# Patient Record
Sex: Male | Born: 1950 | Race: White | Hispanic: No | Marital: Married | Smoking: Never smoker
Health system: Southern US, Community
[De-identification: ages and names within clinical notes are randomized; demographics above are authoritative.]

## PROBLEM LIST (undated history)

## (undated) ENCOUNTER — Emergency Department (HOSPITAL_COMMUNITY): Payer: Self-pay

## (undated) DIAGNOSIS — T7840XA Allergy, unspecified, initial encounter: Secondary | ICD-10-CM

## (undated) DIAGNOSIS — E785 Hyperlipidemia, unspecified: Secondary | ICD-10-CM

## (undated) DIAGNOSIS — K219 Gastro-esophageal reflux disease without esophagitis: Secondary | ICD-10-CM

## (undated) HISTORY — DX: Allergy, unspecified, initial encounter: T78.40XA

## (undated) HISTORY — DX: Gastro-esophageal reflux disease without esophagitis: K21.9

## (undated) HISTORY — DX: Hyperlipidemia, unspecified: E78.5

---

## 2007-02-15 ENCOUNTER — Ambulatory Visit: Payer: Self-pay | Admitting: Gastroenterology

## 2007-03-01 ENCOUNTER — Ambulatory Visit: Payer: Self-pay | Admitting: Gastroenterology

## 2014-08-03 HISTORY — PX: UPPER GASTROINTESTINAL ENDOSCOPY: SHX188

## 2014-11-09 ENCOUNTER — Other Ambulatory Visit: Payer: Self-pay | Admitting: Internal Medicine

## 2014-11-09 DIAGNOSIS — R1310 Dysphagia, unspecified: Secondary | ICD-10-CM

## 2014-11-12 ENCOUNTER — Ambulatory Visit
Admission: RE | Admit: 2014-11-12 | Discharge: 2014-11-12 | Disposition: A | Discharge status: Home / Self Care | Payer: BLUE CROSS/BLUE SHIELD | Source: Ambulatory Visit | Attending: Internal Medicine | Admitting: Internal Medicine

## 2014-11-12 DIAGNOSIS — R1310 Dysphagia, unspecified: Secondary | ICD-10-CM

## 2014-11-29 ENCOUNTER — Other Ambulatory Visit: Payer: Self-pay | Admitting: Gastroenterology

## 2014-11-29 DIAGNOSIS — R1310 Dysphagia, unspecified: Secondary | ICD-10-CM

## 2016-01-23 DIAGNOSIS — J019 Acute sinusitis, unspecified: Secondary | ICD-10-CM | POA: Diagnosis not present

## 2016-02-03 DIAGNOSIS — E785 Hyperlipidemia, unspecified: Secondary | ICD-10-CM | POA: Diagnosis not present

## 2016-02-03 DIAGNOSIS — Z0001 Encounter for general adult medical examination with abnormal findings: Secondary | ICD-10-CM | POA: Diagnosis not present

## 2016-02-03 DIAGNOSIS — Z125 Encounter for screening for malignant neoplasm of prostate: Secondary | ICD-10-CM | POA: Diagnosis not present

## 2016-02-03 DIAGNOSIS — E559 Vitamin D deficiency, unspecified: Secondary | ICD-10-CM | POA: Diagnosis not present

## 2016-02-03 DIAGNOSIS — E039 Hypothyroidism, unspecified: Secondary | ICD-10-CM | POA: Diagnosis not present

## 2016-02-08 IMAGING — RF DG ESOPHAGUS
16 of 19 series · 19 of 24 positions shown · non-contrast
Comparison: None.

CLINICAL DATA: Dysphagia.

EXAM:
ESOPHOGRAM/BARIUM SWALLOW
TECHNIQUE: Combined double contrast and single contrast examination performed
using effervescent crystals, thick barium liquid, and thin barium
liquid.
FLUOROSCOPY TIME:  Radiation Exposure Index (as provided by the
fluoroscopic device): 44 dGy cm2
If the device does not provide the exposure index:
Fluoroscopy Time:  2 minutes and 24 seconds
Number of Acquired Images:

[Series 1: run · 2 of 7 slices shown (1 of 16)]
[im 1/7]
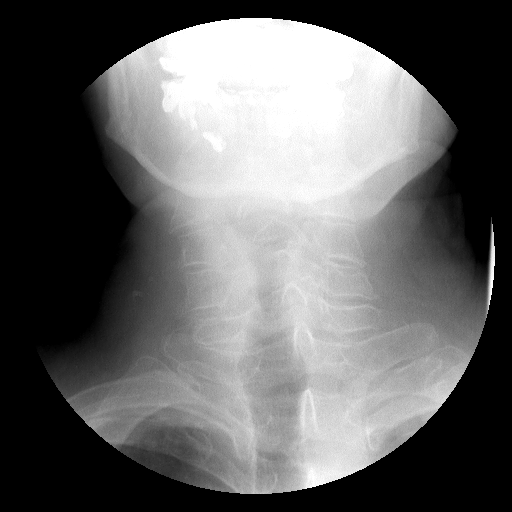
[im 7/7]
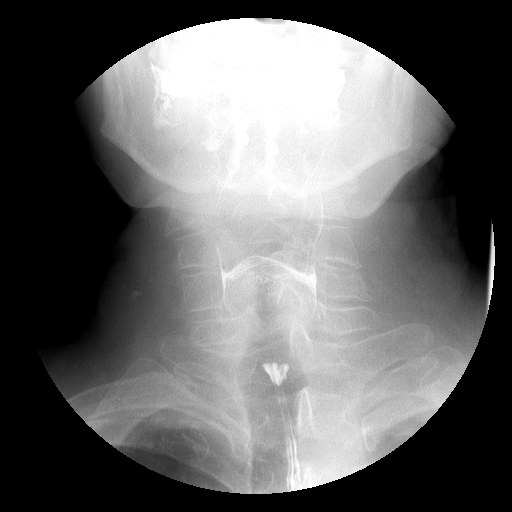

[Series 2: run · 2 of 8 slices shown (2 of 16)]
[im 4/8]
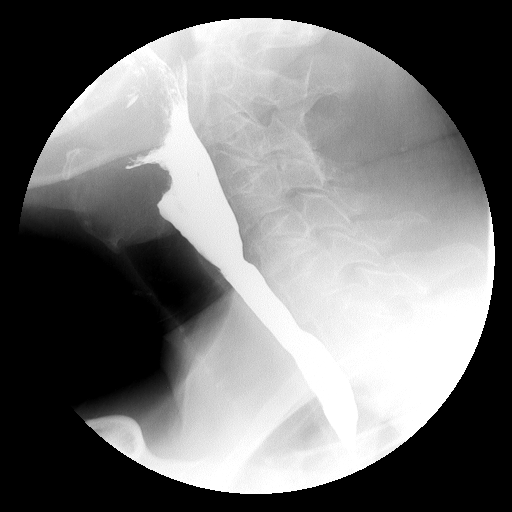
[im 8/8]
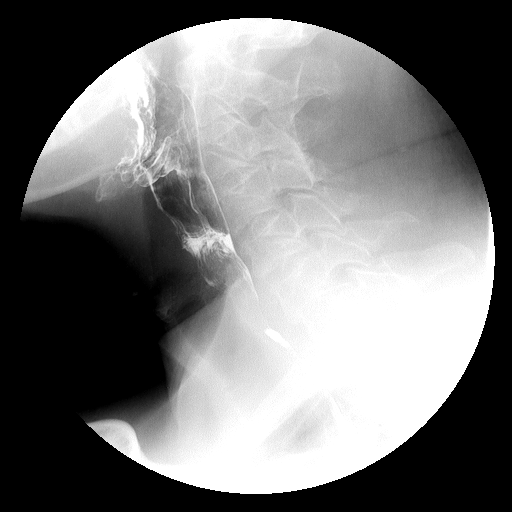

[Series 3: run · 1 of 3 slices shown (3 of 16)]
[im 1/3]
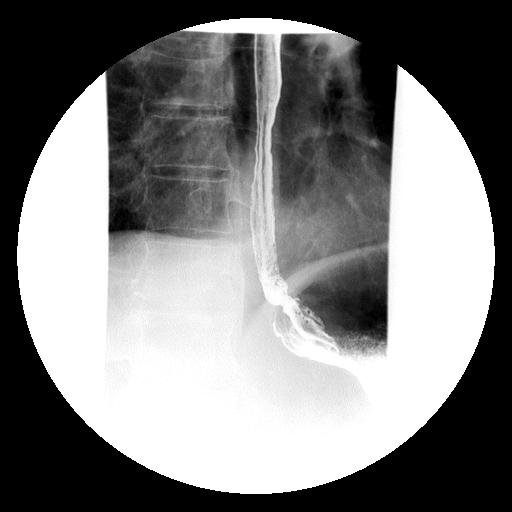

[Series 4: run · 2 of 7 slices shown (4 of 16)]
[im 1/7]
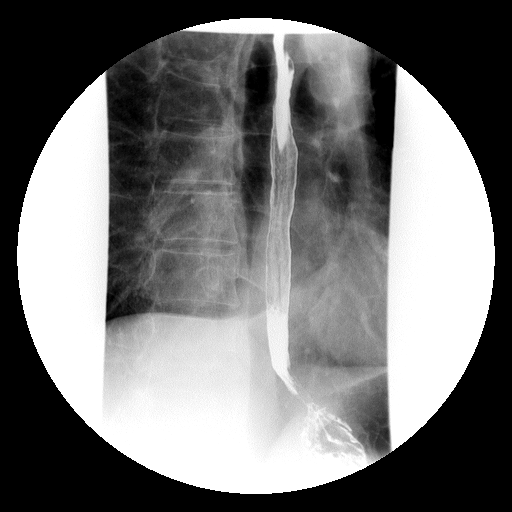
[im 7/7]
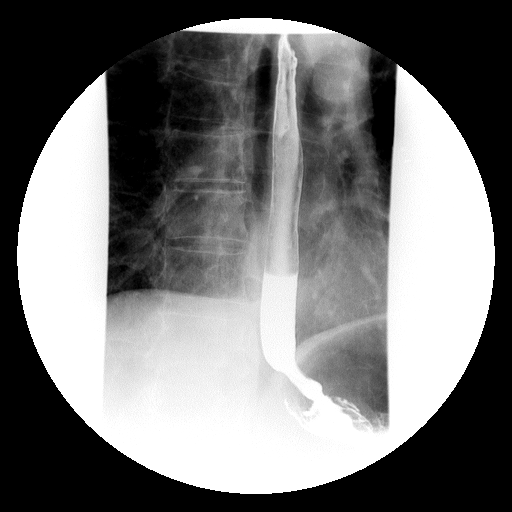

[Series 5: run · 1 of 3 slices shown (5 of 16)]
[im 1/3]
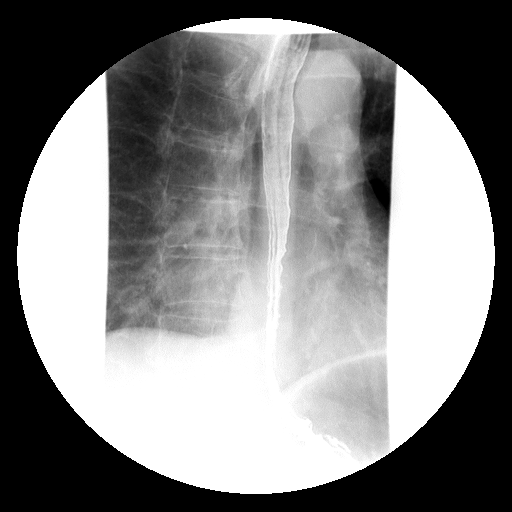

[Series 6: run · 1 of 1 slices shown (6 of 16)]
[im 1/1]
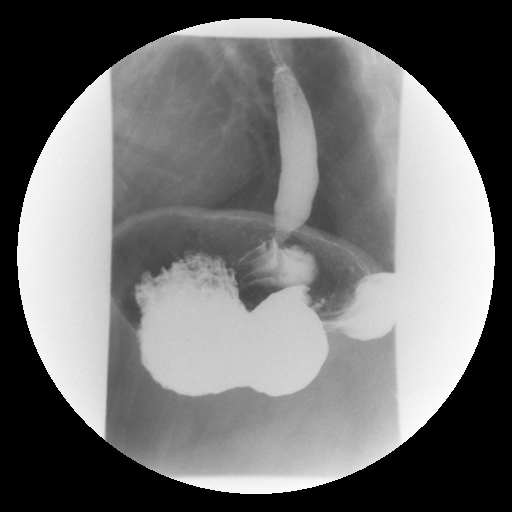

[Series 8: run · 1 of 1 slices shown (7 of 16)]
[im 1/1]
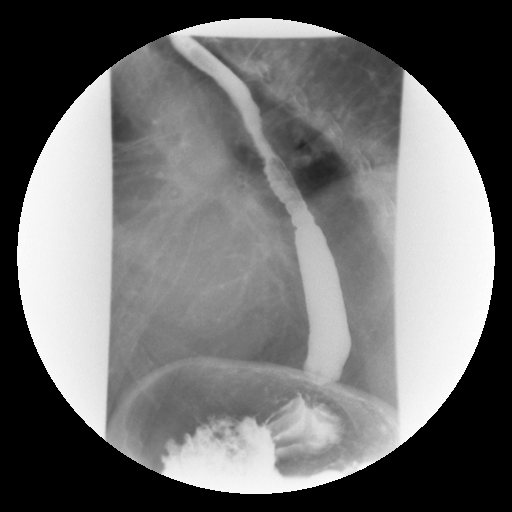

[Series 9: run · 1 of 1 slices shown (8 of 16)]
[im 1/1]
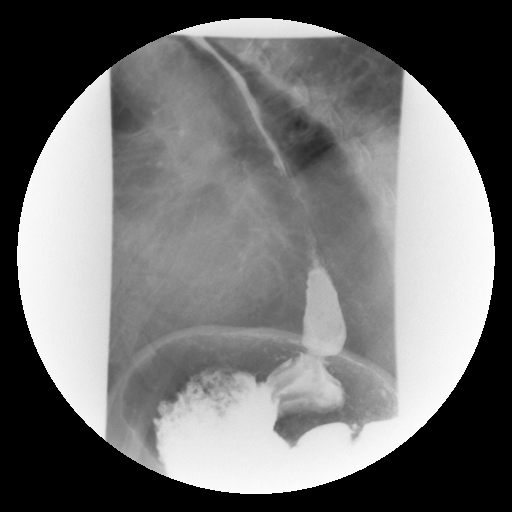

[Series 10: run · 1 of 1 slices shown (9 of 16)]
[im 1/1]
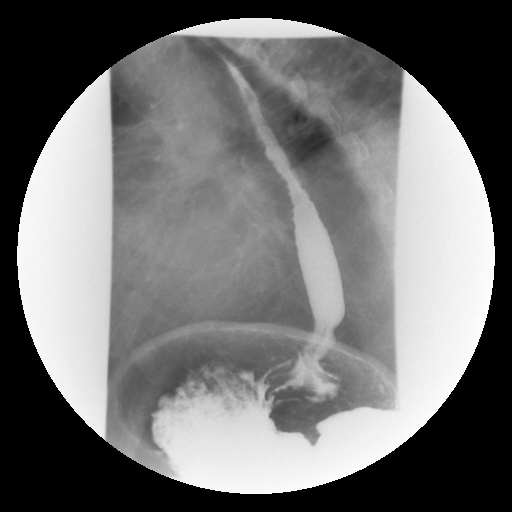

[Series 11: run · 1 of 1 slices shown (10 of 16)]
[im 1/1]
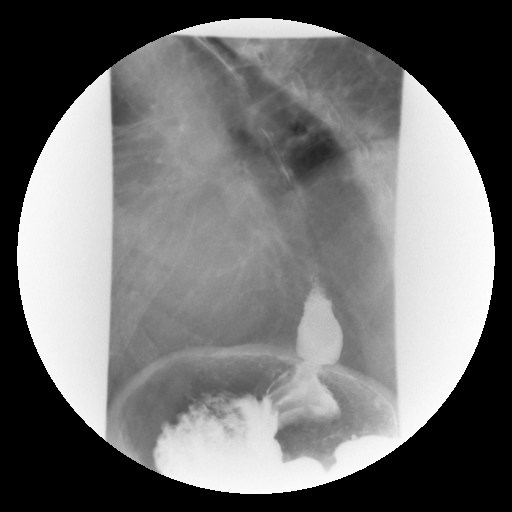

[Series 13: run · 1 of 1 slices shown (11 of 16)]
[im 1/1]
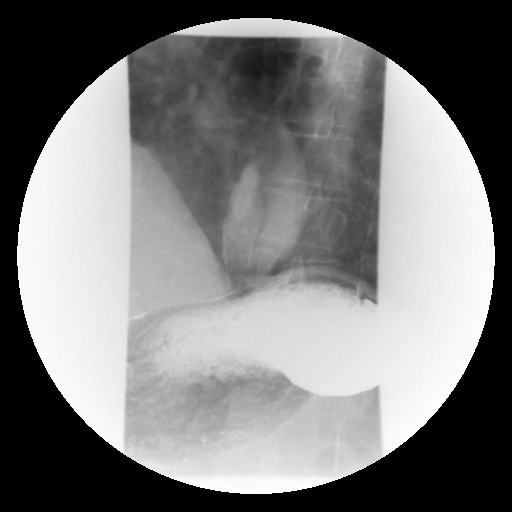

[Series 14: run · 1 of 1 slices shown (12 of 16)]
[im 1/1]
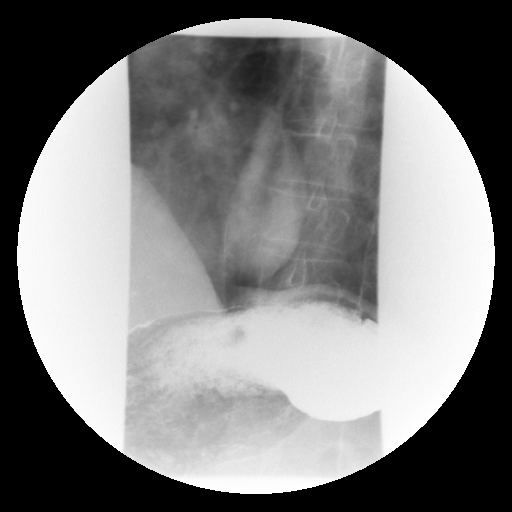

[Series 15: run · 1 of 1 slices shown (13 of 16)]
[im 1/1]
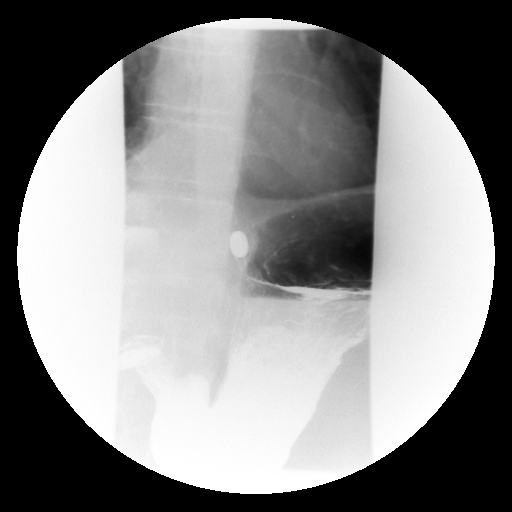

[Series 16: run · 1 of 1 slices shown (14 of 16)]
[im 1/1]
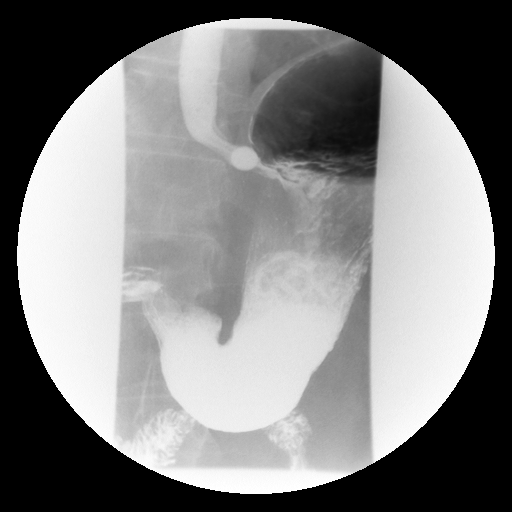

[Series 18: run · 1 of 1 slices shown (15 of 16)]
[im 1/1]
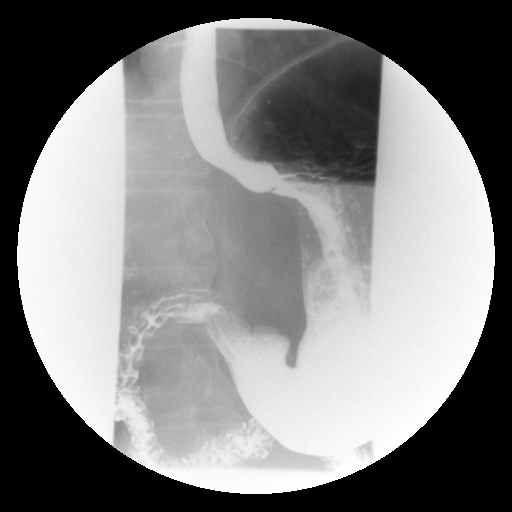

[Series 19: run · 1 of 1 slices shown (16 of 16)]
[im 1/1]
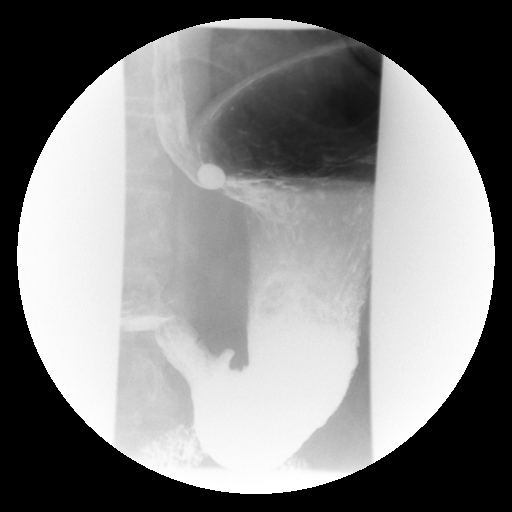

[19 of 24 positions shown; findings below may reference images not displayed]

FINDINGS: Initial barium swallows demonstrate normal pharyngeal motion with
swallowing. No laryngeal penetration or aspiration. No upper
esophageal webs, strictures or diverticuli. Normal esophageal
motility. No intrinsic or extrinsic lesions are identified. No
hiatal hernia. Minimal inducible GE reflux was noted with water
swallowing. The 13 mm barium pill became lodged in the distal
esophagus at the GE junction and would not pass through. Findings
probably represent a reflux stricture or GE junction ring.
IMPRESSION: Smooth narrowing at the GE junction likely a focal reflux stricture
or GE junction ring. The 13 mm barium pill would not pass through
this area.

No hiatal hernia. Minimal inducible GE reflux with water swallowing.

## 2016-02-10 DIAGNOSIS — K219 Gastro-esophageal reflux disease without esophagitis: Secondary | ICD-10-CM | POA: Diagnosis not present

## 2016-02-10 DIAGNOSIS — E785 Hyperlipidemia, unspecified: Secondary | ICD-10-CM | POA: Diagnosis not present

## 2016-02-10 DIAGNOSIS — E038 Other specified hypothyroidism: Secondary | ICD-10-CM | POA: Diagnosis not present

## 2016-02-10 DIAGNOSIS — J309 Allergic rhinitis, unspecified: Secondary | ICD-10-CM | POA: Diagnosis not present

## 2016-02-10 DIAGNOSIS — E559 Vitamin D deficiency, unspecified: Secondary | ICD-10-CM | POA: Diagnosis not present

## 2016-02-10 DIAGNOSIS — Z23 Encounter for immunization: Secondary | ICD-10-CM | POA: Diagnosis not present

## 2016-05-26 DIAGNOSIS — Z23 Encounter for immunization: Secondary | ICD-10-CM | POA: Diagnosis not present

## 2016-08-25 DIAGNOSIS — E038 Other specified hypothyroidism: Secondary | ICD-10-CM | POA: Diagnosis not present

## 2016-08-25 DIAGNOSIS — K219 Gastro-esophageal reflux disease without esophagitis: Secondary | ICD-10-CM | POA: Diagnosis not present

## 2016-08-25 DIAGNOSIS — E559 Vitamin D deficiency, unspecified: Secondary | ICD-10-CM | POA: Diagnosis not present

## 2016-08-25 DIAGNOSIS — E785 Hyperlipidemia, unspecified: Secondary | ICD-10-CM | POA: Diagnosis not present

## 2016-09-01 DIAGNOSIS — Z23 Encounter for immunization: Secondary | ICD-10-CM | POA: Diagnosis not present

## 2016-09-01 DIAGNOSIS — K219 Gastro-esophageal reflux disease without esophagitis: Secondary | ICD-10-CM | POA: Diagnosis not present

## 2016-09-01 DIAGNOSIS — E038 Other specified hypothyroidism: Secondary | ICD-10-CM | POA: Diagnosis not present

## 2016-09-01 DIAGNOSIS — E559 Vitamin D deficiency, unspecified: Secondary | ICD-10-CM | POA: Diagnosis not present

## 2016-09-01 DIAGNOSIS — E785 Hyperlipidemia, unspecified: Secondary | ICD-10-CM | POA: Diagnosis not present

## 2016-11-27 DIAGNOSIS — H52223 Regular astigmatism, bilateral: Secondary | ICD-10-CM | POA: Diagnosis not present

## 2016-11-27 DIAGNOSIS — H2513 Age-related nuclear cataract, bilateral: Secondary | ICD-10-CM | POA: Diagnosis not present

## 2016-11-27 DIAGNOSIS — H5203 Hypermetropia, bilateral: Secondary | ICD-10-CM | POA: Diagnosis not present

## 2016-11-27 DIAGNOSIS — H524 Presbyopia: Secondary | ICD-10-CM | POA: Diagnosis not present

## 2016-12-25 ENCOUNTER — Encounter: Payer: Self-pay | Admitting: Gastroenterology

## 2017-01-12 ENCOUNTER — Encounter: Payer: Self-pay | Admitting: Gastroenterology

## 2017-03-03 ENCOUNTER — Telehealth: Payer: Self-pay | Admitting: *Deleted

## 2017-03-03 ENCOUNTER — Ambulatory Visit (AMBULATORY_SURGERY_CENTER): Payer: Self-pay | Admitting: *Deleted

## 2017-03-03 VITALS — Ht 71.0 in | Wt 178.4 lb

## 2017-03-03 DIAGNOSIS — Z1211 Encounter for screening for malignant neoplasm of colon: Secondary | ICD-10-CM

## 2017-03-03 MED ORDER — NA SULFATE-K SULFATE-MG SULF 17.5-3.13-1.6 GM/177ML PO SOLN: ORAL | 0 refills | Status: DC

## 2017-03-03 MED ORDER — ONDANSETRON HCL 4 MG PO TABS: 4.0000 mg | ORAL_TABLET | ORAL | 0 refills | Status: DC

## 2017-03-03 NOTE — Progress Notes (Signed)
No allergies to eggs or soy. No problems with anesthesia.  Pt given Emmi instructions for colonoscopy  No oxygen use  No diet drug use  

## 2017-03-03 NOTE — Telephone Encounter (Signed)
Ok,  zofran 4mg  pills, take one pill 1 hour prior to starting each stage of the prep.  Disp 2 pills, no refills.  Thanks

## 2017-03-03 NOTE — Telephone Encounter (Signed)
Pt notified that Rx for Zofran has been sent to his pharmacy

## 2017-03-03 NOTE — Telephone Encounter (Signed)
Dr Ardis Hughs: pt is scheduled for recall colon 8/15.  Last colonoscopy 2008.  Pt had nausea and vomiting with last prep and is requesting nausea me to take before prep.  Thanks, Juliann Pulse in [facility]

## 2017-03-12 DIAGNOSIS — E784 Other hyperlipidemia: Secondary | ICD-10-CM | POA: Diagnosis not present

## 2017-03-12 DIAGNOSIS — J3089 Other allergic rhinitis: Secondary | ICD-10-CM | POA: Diagnosis not present

## 2017-03-12 DIAGNOSIS — K219 Gastro-esophageal reflux disease without esophagitis: Secondary | ICD-10-CM | POA: Diagnosis not present

## 2017-03-12 DIAGNOSIS — Z1389 Encounter for screening for other disorder: Secondary | ICD-10-CM | POA: Diagnosis not present

## 2017-03-12 DIAGNOSIS — Z0000 Encounter for general adult medical examination without abnormal findings: Secondary | ICD-10-CM | POA: Diagnosis not present

## 2017-03-12 DIAGNOSIS — Z125 Encounter for screening for malignant neoplasm of prostate: Secondary | ICD-10-CM | POA: Diagnosis not present

## 2017-03-12 DIAGNOSIS — R946 Abnormal results of thyroid function studies: Secondary | ICD-10-CM | POA: Diagnosis not present

## 2017-03-12 DIAGNOSIS — Z6824 Body mass index (BMI) 24.0-24.9, adult: Secondary | ICD-10-CM | POA: Diagnosis not present

## 2017-03-17 ENCOUNTER — Ambulatory Visit (AMBULATORY_SURGERY_CENTER): Payer: Medicare Other | Admitting: Gastroenterology

## 2017-03-17 ENCOUNTER — Encounter: Payer: Self-pay | Admitting: Gastroenterology

## 2017-03-17 VITALS — BP 105/66 | HR 78 | Temp 97.8°F | Resp 15 | Ht 71.0 in | Wt 178.0 lb

## 2017-03-17 DIAGNOSIS — D120 Benign neoplasm of cecum: Secondary | ICD-10-CM | POA: Diagnosis not present

## 2017-03-17 DIAGNOSIS — K5730 Diverticulosis of large intestine without perforation or abscess without bleeding: Secondary | ICD-10-CM | POA: Diagnosis not present

## 2017-03-17 DIAGNOSIS — Z1211 Encounter for screening for malignant neoplasm of colon: Secondary | ICD-10-CM | POA: Diagnosis not present

## 2017-03-17 DIAGNOSIS — Z1212 Encounter for screening for malignant neoplasm of rectum: Secondary | ICD-10-CM | POA: Diagnosis not present

## 2017-03-17 DIAGNOSIS — K635 Polyp of colon: Secondary | ICD-10-CM

## 2017-03-17 MED ORDER — SODIUM CHLORIDE 0.9 % IV SOLN: 500.0000 mL | INTRAVENOUS | Status: AC

## 2017-03-17 NOTE — Op Note (Signed)
[facility] Patient Name: John Bruce Procedure Date: 03/17/2017 8:17 AM MRN: [id number] Endoscopist: Milus Banister , MD Age: 66 Referring MD:  Date of Birth: 07-Nov-1950 Gender: Male Account #: [id number] Procedure:                Colonoscopy Indications:              Screening for colorectal malignant neoplasm Medicines:                Monitored Anesthesia Care Procedure:                Pre-Anesthesia Assessment:                           - Prior to the procedure, a History and Physical                            was performed, and patient medications and                            allergies were reviewed. The patient's tolerance of                            previous anesthesia was also reviewed. The risks                            and benefits of the procedure and the sedation                            options and risks were discussed with the patient.                            All questions were answered, and informed consent                            was obtained. Prior Anticoagulants: The patient has                            taken no previous anticoagulant or antiplatelet                            agents. ASA Grade Assessment: III - A patient with                            severe systemic disease. After reviewing the risks                            and benefits, the patient was deemed in                            satisfactory condition to undergo the procedure.                           After obtaining informed consent, the colonoscope  was passed under direct vision. Throughout the                            procedure, the patient's blood pressure, pulse, and                            oxygen saturations were monitored continuously. The                            Colonoscope was introduced through the anus and                            advanced to the the cecum, identified by                            appendiceal orifice and  ileocecal valve. The                            colonoscopy was performed without difficulty. The                            patient tolerated the procedure well. The quality                            of the bowel preparation was good. The ileocecal                            valve, appendiceal orifice, and rectum were                            photographed. Scope In: 8:33:29 AM Scope Out: 8:45:30 AM Scope Withdrawal Time: 0 hours 8 minutes 20 seconds  Total Procedure Duration: 0 hours 12 minutes 1 second  Findings:                 A 9 mm polyp was found in the cecum. The polyp was                            sessile. The polyp was removed with a hot snare.                            Resection and retrieval were complete.                           Scattered small-mouthed diverticula were found in                            the entire colon.                           The exam was otherwise without abnormality on                            direct and retroflexion views. Complications:  No immediate complications. Estimated blood loss:                            None. Estimated Blood Loss:     Estimated blood loss: none. Impression:               - One 9 mm polyp in the cecum, removed with a hot                            snare. Resected and retrieved.                           - Diverticulosis in the entire examined colon.                           - The examination was otherwise normal on direct                            and retroflexion views. Recommendation:           - Patient has a contact number available for                            emergencies. The signs and symptoms of potential                            delayed complications were discussed with the                            patient. Return to normal activities tomorrow.                            Written discharge instructions were provided to the                            patient.                           -  Resume previous diet.                           - Continue present medications.                           You will receive a letter within 2-3 weeks with the                            pathology results and my final recommendations.                           If the polyp(s) is proven to be 'pre-cancerous' on                            pathology, you will need repeat colonoscopy in 5  years. If the polyp(s) is NOT 'precancerous' on                            pathology then you should repeat colon cancer                            screening in 10 years with colonoscopy without need                            for colon cancer screening by any method prior to                            then (including stool testing). Milus Banister, MD 03/17/2017 8:53:35 AM This report has been signed electronically.

## 2017-03-17 NOTE — Progress Notes (Signed)
Called to room to assist during endoscopic procedure.  Patient ID and intended procedure confirmed with present staff. Received instructions for my participation in the procedure from the performing physician.  

## 2017-03-17 NOTE — Progress Notes (Signed)
To recovery, report to [place], RN, VSS 

## 2017-03-17 NOTE — Patient Instructions (Signed)
YOU HAD AN ENDOSCOPIC PROCEDURE TODAY AT THE [facility]:   Refer to the procedure report that was given to you for any specific questions about what was found during the examination.  If the procedure report does not answer your questions, please call your gastroenterologist to clarify.  If you requested that your care partner not be given the details of your procedure findings, then the procedure report has been included in a sealed envelope for you to review at your convenience later.  YOU SHOULD EXPECT: Some feelings of bloating in the abdomen. Passage of more gas than usual.  Walking can help get rid of the air that was put into your GI tract during the procedure and reduce the bloating. If you had a lower endoscopy (such as a colonoscopy or flexible sigmoidoscopy) you may notice spotting of blood in your stool or on the toilet paper. If you underwent a bowel prep for your procedure, you may not have a normal bowel movement for a few days.  Please Note:  You might notice some irritation and congestion in your nose or some drainage.  This is from the oxygen used during your procedure.  There is no need for concern and it should clear up in a day or so.  SYMPTOMS TO REPORT IMMEDIATELY:   Following lower endoscopy (colonoscopy or flexible sigmoidoscopy):  Excessive amounts of blood in the stool  Significant tenderness or worsening of abdominal pains  Swelling of the abdomen that is new, acute  Fever of 100F or higher    For urgent or emergent issues, a gastroenterologist can be reached at any hour by calling [phone].   DIET:  We do recommend a small meal at first, but then you may proceed to your regular diet.  Drink plenty of fluids but you should avoid alcoholic beverages for 24 hours.  ACTIVITY:  You should plan to take it easy for the rest of today and you should NOT DRIVE or use heavy machinery until tomorrow (because of the sedation medicines used during the test).     FOLLOW UP: Our staff will call the number listed on your records the next business day following your procedure to check on you and address any questions or concerns that you may have regarding the information given to you following your procedure. If we do not reach you, we will leave a message.  However, if you are feeling well and you are not experiencing any problems, there is no need to return our call.  We will assume that you have returned to your regular daily activities without incident.  If any biopsies were taken you will be contacted by phone or by letter within the next 1-3 weeks.  Please call us at [phone] if you have not heard about the biopsies in 3 weeks.    SIGNATURES/CONFIDENTIALITY: You and/or your care partner have signed paperwork which will be entered into your electronic medical record.  These signatures attest to the fact that that the information above on your After Visit Summary has been reviewed and is understood.  Full responsibility of the confidentiality of this discharge information lies with you and/or your care-partner.   Resume medications. Information given on polyps and diverticulosis. 

## 2017-03-18 ENCOUNTER — Telehealth: Payer: Self-pay | Admitting: *Deleted

## 2017-03-18 NOTE — Telephone Encounter (Signed)
  Follow up Call-  Call back number 03/17/2017  Post procedure Call Back phone  # [phone]  Permission to leave phone message Yes  Some recent data might be hidden     Patient questions:  Do you have a fever, pain , or abdominal swelling? No. Pain Score  0 *  Have you tolerated food without any problems? Yes.    Have you been able to return to your normal activities? Yes.    Do you have any questions about your discharge instructions: Diet   No. Medications  No. Follow up visit  No.  Do you have questions or concerns about your Care? No.  Actions: * If pain score is 4 or above: No action needed, pain <4.

## 2017-03-22 ENCOUNTER — Encounter: Payer: Self-pay | Admitting: Gastroenterology

## 2017-04-25 DIAGNOSIS — Z23 Encounter for immunization: Secondary | ICD-10-CM | POA: Diagnosis not present

## 2017-06-18 DIAGNOSIS — H6691 Otitis media, unspecified, right ear: Secondary | ICD-10-CM | POA: Diagnosis not present

## 2017-06-18 DIAGNOSIS — E7849 Other hyperlipidemia: Secondary | ICD-10-CM | POA: Diagnosis not present

## 2017-06-18 DIAGNOSIS — Z6824 Body mass index (BMI) 24.0-24.9, adult: Secondary | ICD-10-CM | POA: Diagnosis not present

## 2017-10-07 DIAGNOSIS — L570 Actinic keratosis: Secondary | ICD-10-CM | POA: Diagnosis not present

## 2017-10-07 DIAGNOSIS — B078 Other viral warts: Secondary | ICD-10-CM | POA: Diagnosis not present

## 2017-10-07 DIAGNOSIS — L821 Other seborrheic keratosis: Secondary | ICD-10-CM | POA: Diagnosis not present

## 2017-10-07 DIAGNOSIS — D1801 Hemangioma of skin and subcutaneous tissue: Secondary | ICD-10-CM | POA: Diagnosis not present

## 2017-10-07 DIAGNOSIS — D2372 Other benign neoplasm of skin of left lower limb, including hip: Secondary | ICD-10-CM | POA: Diagnosis not present

## 2017-10-07 DIAGNOSIS — L918 Other hypertrophic disorders of the skin: Secondary | ICD-10-CM | POA: Diagnosis not present

## 2018-04-18 DIAGNOSIS — Z125 Encounter for screening for malignant neoplasm of prostate: Secondary | ICD-10-CM | POA: Diagnosis not present

## 2018-04-18 DIAGNOSIS — R82998 Other abnormal findings in urine: Secondary | ICD-10-CM | POA: Diagnosis not present

## 2018-04-18 DIAGNOSIS — E7849 Other hyperlipidemia: Secondary | ICD-10-CM | POA: Diagnosis not present

## 2018-04-18 DIAGNOSIS — R946 Abnormal results of thyroid function studies: Secondary | ICD-10-CM | POA: Diagnosis not present

## 2018-04-25 DIAGNOSIS — Z0000 Encounter for general adult medical examination without abnormal findings: Secondary | ICD-10-CM | POA: Diagnosis not present

## 2018-04-25 DIAGNOSIS — R252 Cramp and spasm: Secondary | ICD-10-CM | POA: Diagnosis not present

## 2018-04-25 DIAGNOSIS — Z23 Encounter for immunization: Secondary | ICD-10-CM | POA: Diagnosis not present

## 2018-04-25 DIAGNOSIS — E7849 Other hyperlipidemia: Secondary | ICD-10-CM | POA: Diagnosis not present

## 2018-04-25 DIAGNOSIS — K219 Gastro-esophageal reflux disease without esophagitis: Secondary | ICD-10-CM | POA: Diagnosis not present

## 2018-04-25 DIAGNOSIS — Z6824 Body mass index (BMI) 24.0-24.9, adult: Secondary | ICD-10-CM | POA: Diagnosis not present

## 2018-04-25 DIAGNOSIS — R946 Abnormal results of thyroid function studies: Secondary | ICD-10-CM | POA: Diagnosis not present

## 2018-04-25 DIAGNOSIS — Z1389 Encounter for screening for other disorder: Secondary | ICD-10-CM | POA: Diagnosis not present

## 2018-04-25 DIAGNOSIS — J3089 Other allergic rhinitis: Secondary | ICD-10-CM | POA: Diagnosis not present

## 2018-04-25 DIAGNOSIS — R1319 Other dysphagia: Secondary | ICD-10-CM | POA: Diagnosis not present

## 2018-04-25 DIAGNOSIS — I498 Other specified cardiac arrhythmias: Secondary | ICD-10-CM | POA: Diagnosis not present

## 2018-04-28 DIAGNOSIS — Z1212 Encounter for screening for malignant neoplasm of rectum: Secondary | ICD-10-CM | POA: Diagnosis not present

## 2018-05-16 DIAGNOSIS — Z23 Encounter for immunization: Secondary | ICD-10-CM | POA: Diagnosis not present

## 2018-10-10 DIAGNOSIS — D1801 Hemangioma of skin and subcutaneous tissue: Secondary | ICD-10-CM | POA: Diagnosis not present

## 2018-10-10 DIAGNOSIS — L821 Other seborrheic keratosis: Secondary | ICD-10-CM | POA: Diagnosis not present

## 2018-10-10 DIAGNOSIS — D2372 Other benign neoplasm of skin of left lower limb, including hip: Secondary | ICD-10-CM | POA: Diagnosis not present

## 2018-12-19 DIAGNOSIS — H52223 Regular astigmatism, bilateral: Secondary | ICD-10-CM | POA: Diagnosis not present

## 2018-12-19 DIAGNOSIS — H2513 Age-related nuclear cataract, bilateral: Secondary | ICD-10-CM | POA: Diagnosis not present

## 2018-12-19 DIAGNOSIS — H5203 Hypermetropia, bilateral: Secondary | ICD-10-CM | POA: Diagnosis not present

## 2018-12-19 DIAGNOSIS — H524 Presbyopia: Secondary | ICD-10-CM | POA: Diagnosis not present

## 2019-02-02 DIAGNOSIS — L570 Actinic keratosis: Secondary | ICD-10-CM | POA: Diagnosis not present

## 2019-02-02 DIAGNOSIS — L720 Epidermal cyst: Secondary | ICD-10-CM | POA: Diagnosis not present

## 2019-02-02 DIAGNOSIS — L80 Vitiligo: Secondary | ICD-10-CM | POA: Diagnosis not present

## 2019-05-05 DIAGNOSIS — Z23 Encounter for immunization: Secondary | ICD-10-CM | POA: Diagnosis not present

## 2019-05-30 DIAGNOSIS — E7849 Other hyperlipidemia: Secondary | ICD-10-CM | POA: Diagnosis not present

## 2019-05-30 DIAGNOSIS — Z125 Encounter for screening for malignant neoplasm of prostate: Secondary | ICD-10-CM | POA: Diagnosis not present

## 2019-06-06 DIAGNOSIS — K219 Gastro-esophageal reflux disease without esophagitis: Secondary | ICD-10-CM | POA: Diagnosis not present

## 2019-06-06 DIAGNOSIS — R946 Abnormal results of thyroid function studies: Secondary | ICD-10-CM | POA: Diagnosis not present

## 2019-06-06 DIAGNOSIS — J309 Allergic rhinitis, unspecified: Secondary | ICD-10-CM | POA: Diagnosis not present

## 2019-06-06 DIAGNOSIS — R82998 Other abnormal findings in urine: Secondary | ICD-10-CM | POA: Diagnosis not present

## 2019-06-06 DIAGNOSIS — Z1331 Encounter for screening for depression: Secondary | ICD-10-CM | POA: Diagnosis not present

## 2019-06-06 DIAGNOSIS — Z0000 Encounter for general adult medical examination without abnormal findings: Secondary | ICD-10-CM | POA: Diagnosis not present

## 2019-06-06 DIAGNOSIS — E785 Hyperlipidemia, unspecified: Secondary | ICD-10-CM | POA: Diagnosis not present

## 2019-06-06 DIAGNOSIS — H6691 Otitis media, unspecified, right ear: Secondary | ICD-10-CM | POA: Diagnosis not present

## 2019-06-08 DIAGNOSIS — Z1212 Encounter for screening for malignant neoplasm of rectum: Secondary | ICD-10-CM | POA: Diagnosis not present

## 2019-06-19 ENCOUNTER — Other Ambulatory Visit: Payer: Self-pay

## 2019-06-19 DIAGNOSIS — E785 Hyperlipidemia, unspecified: Secondary | ICD-10-CM

## 2019-07-11 ENCOUNTER — Encounter (INDEPENDENT_AMBULATORY_CARE_PROVIDER_SITE_OTHER): Payer: Self-pay

## 2019-07-11 ENCOUNTER — Ambulatory Visit (INDEPENDENT_AMBULATORY_CARE_PROVIDER_SITE_OTHER)
Admission: RE | Admit: 2019-07-11 | Discharge: 2019-07-11 | Disposition: A | Discharge status: Home / Self Care | Payer: Self-pay | Source: Ambulatory Visit | Attending: Cardiology | Admitting: Cardiology

## 2019-07-11 ENCOUNTER — Other Ambulatory Visit: Payer: Self-pay

## 2019-07-11 DIAGNOSIS — E785 Hyperlipidemia, unspecified: Secondary | ICD-10-CM

## 2019-09-07 DIAGNOSIS — E039 Hypothyroidism, unspecified: Secondary | ICD-10-CM | POA: Diagnosis not present

## 2019-09-07 DIAGNOSIS — R946 Abnormal results of thyroid function studies: Secondary | ICD-10-CM | POA: Diagnosis not present

## 2019-10-30 DIAGNOSIS — L80 Vitiligo: Secondary | ICD-10-CM | POA: Diagnosis not present

## 2019-10-30 DIAGNOSIS — L853 Xerosis cutis: Secondary | ICD-10-CM | POA: Diagnosis not present

## 2019-10-30 DIAGNOSIS — D2261 Melanocytic nevi of right upper limb, including shoulder: Secondary | ICD-10-CM | POA: Diagnosis not present

## 2019-10-30 DIAGNOSIS — D1801 Hemangioma of skin and subcutaneous tissue: Secondary | ICD-10-CM | POA: Diagnosis not present

## 2019-10-30 DIAGNOSIS — L821 Other seborrheic keratosis: Secondary | ICD-10-CM | POA: Diagnosis not present

## 2019-10-30 DIAGNOSIS — L570 Actinic keratosis: Secondary | ICD-10-CM | POA: Diagnosis not present

## 2019-10-30 DIAGNOSIS — D2372 Other benign neoplasm of skin of left lower limb, including hip: Secondary | ICD-10-CM | POA: Diagnosis not present

## 2020-05-04 DIAGNOSIS — Z23 Encounter for immunization: Secondary | ICD-10-CM | POA: Diagnosis not present

## 2020-06-08 DIAGNOSIS — Z23 Encounter for immunization: Secondary | ICD-10-CM | POA: Diagnosis not present

## 2020-06-11 DIAGNOSIS — E785 Hyperlipidemia, unspecified: Secondary | ICD-10-CM | POA: Diagnosis not present

## 2020-06-11 DIAGNOSIS — Z125 Encounter for screening for malignant neoplasm of prostate: Secondary | ICD-10-CM | POA: Diagnosis not present

## 2020-06-18 DIAGNOSIS — R82998 Other abnormal findings in urine: Secondary | ICD-10-CM | POA: Diagnosis not present

## 2020-06-18 DIAGNOSIS — J309 Allergic rhinitis, unspecified: Secondary | ICD-10-CM | POA: Diagnosis not present

## 2020-06-18 DIAGNOSIS — K219 Gastro-esophageal reflux disease without esophagitis: Secondary | ICD-10-CM | POA: Diagnosis not present

## 2020-06-18 DIAGNOSIS — Z1331 Encounter for screening for depression: Secondary | ICD-10-CM | POA: Diagnosis not present

## 2020-06-18 DIAGNOSIS — R946 Abnormal results of thyroid function studies: Secondary | ICD-10-CM | POA: Diagnosis not present

## 2020-06-18 DIAGNOSIS — H9319 Tinnitus, unspecified ear: Secondary | ICD-10-CM | POA: Diagnosis not present

## 2020-06-18 DIAGNOSIS — E785 Hyperlipidemia, unspecified: Secondary | ICD-10-CM | POA: Diagnosis not present

## 2020-06-18 DIAGNOSIS — Z0000 Encounter for general adult medical examination without abnormal findings: Secondary | ICD-10-CM | POA: Diagnosis not present

## 2020-07-05 DIAGNOSIS — Z1212 Encounter for screening for malignant neoplasm of rectum: Secondary | ICD-10-CM | POA: Diagnosis not present

## 2020-10-08 DIAGNOSIS — M5459 Other low back pain: Secondary | ICD-10-CM | POA: Diagnosis not present

## 2020-11-05 DIAGNOSIS — D1801 Hemangioma of skin and subcutaneous tissue: Secondary | ICD-10-CM | POA: Diagnosis not present

## 2020-11-05 DIAGNOSIS — L821 Other seborrheic keratosis: Secondary | ICD-10-CM | POA: Diagnosis not present

## 2020-11-05 DIAGNOSIS — L80 Vitiligo: Secondary | ICD-10-CM | POA: Diagnosis not present

## 2020-11-05 DIAGNOSIS — L570 Actinic keratosis: Secondary | ICD-10-CM | POA: Diagnosis not present

## 2020-11-06 DIAGNOSIS — H5203 Hypermetropia, bilateral: Secondary | ICD-10-CM | POA: Diagnosis not present

## 2020-12-02 DIAGNOSIS — R946 Abnormal results of thyroid function studies: Secondary | ICD-10-CM | POA: Diagnosis not present

## 2021-06-30 DIAGNOSIS — J309 Allergic rhinitis, unspecified: Secondary | ICD-10-CM | POA: Diagnosis not present

## 2021-06-30 DIAGNOSIS — J029 Acute pharyngitis, unspecified: Secondary | ICD-10-CM | POA: Diagnosis not present

## 2021-06-30 DIAGNOSIS — Z1152 Encounter for screening for COVID-19: Secondary | ICD-10-CM | POA: Diagnosis not present

## 2021-06-30 DIAGNOSIS — R0981 Nasal congestion: Secondary | ICD-10-CM | POA: Diagnosis not present

## 2021-06-30 DIAGNOSIS — R059 Cough, unspecified: Secondary | ICD-10-CM | POA: Diagnosis not present

## 2021-06-30 DIAGNOSIS — J0191 Acute recurrent sinusitis, unspecified: Secondary | ICD-10-CM | POA: Diagnosis not present

## 2021-06-30 DIAGNOSIS — R519 Headache, unspecified: Secondary | ICD-10-CM | POA: Diagnosis not present

## 2021-08-06 DIAGNOSIS — E785 Hyperlipidemia, unspecified: Secondary | ICD-10-CM | POA: Diagnosis not present

## 2021-08-06 DIAGNOSIS — Z125 Encounter for screening for malignant neoplasm of prostate: Secondary | ICD-10-CM | POA: Diagnosis not present

## 2021-08-06 DIAGNOSIS — R946 Abnormal results of thyroid function studies: Secondary | ICD-10-CM | POA: Diagnosis not present

## 2021-08-13 DIAGNOSIS — Z1152 Encounter for screening for COVID-19: Secondary | ICD-10-CM | POA: Diagnosis not present

## 2021-08-13 DIAGNOSIS — Z1339 Encounter for screening examination for other mental health and behavioral disorders: Secondary | ICD-10-CM | POA: Diagnosis not present

## 2021-08-13 DIAGNOSIS — E785 Hyperlipidemia, unspecified: Secondary | ICD-10-CM | POA: Diagnosis not present

## 2021-08-13 DIAGNOSIS — R0981 Nasal congestion: Secondary | ICD-10-CM | POA: Diagnosis not present

## 2021-08-13 DIAGNOSIS — J309 Allergic rhinitis, unspecified: Secondary | ICD-10-CM | POA: Diagnosis not present

## 2021-08-13 DIAGNOSIS — Z0000 Encounter for general adult medical examination without abnormal findings: Secondary | ICD-10-CM | POA: Diagnosis not present

## 2021-08-13 DIAGNOSIS — K219 Gastro-esophageal reflux disease without esophagitis: Secondary | ICD-10-CM | POA: Diagnosis not present

## 2021-08-13 DIAGNOSIS — H9319 Tinnitus, unspecified ear: Secondary | ICD-10-CM | POA: Diagnosis not present

## 2021-08-13 DIAGNOSIS — Z1331 Encounter for screening for depression: Secondary | ICD-10-CM | POA: Diagnosis not present

## 2021-08-13 DIAGNOSIS — R82998 Other abnormal findings in urine: Secondary | ICD-10-CM | POA: Diagnosis not present

## 2021-08-13 DIAGNOSIS — I499 Cardiac arrhythmia, unspecified: Secondary | ICD-10-CM | POA: Diagnosis not present

## 2021-08-13 DIAGNOSIS — Z1212 Encounter for screening for malignant neoplasm of rectum: Secondary | ICD-10-CM | POA: Diagnosis not present

## 2021-10-02 DIAGNOSIS — M25512 Pain in left shoulder: Secondary | ICD-10-CM | POA: Diagnosis not present

## 2021-11-17 DIAGNOSIS — L814 Other melanin hyperpigmentation: Secondary | ICD-10-CM | POA: Diagnosis not present

## 2021-11-17 DIAGNOSIS — L300 Nummular dermatitis: Secondary | ICD-10-CM | POA: Diagnosis not present

## 2021-11-17 DIAGNOSIS — D1801 Hemangioma of skin and subcutaneous tissue: Secondary | ICD-10-CM | POA: Diagnosis not present

## 2021-11-17 DIAGNOSIS — L80 Vitiligo: Secondary | ICD-10-CM | POA: Diagnosis not present

## 2021-11-17 DIAGNOSIS — L821 Other seborrheic keratosis: Secondary | ICD-10-CM | POA: Diagnosis not present

## 2022-04-21 ENCOUNTER — Encounter: Payer: Self-pay | Admitting: Allergy and Immunology

## 2022-04-21 ENCOUNTER — Ambulatory Visit: Payer: Medicare HMO | Admitting: Allergy and Immunology

## 2022-04-21 VITALS — BP 130/80 | HR 78 | Temp 97.5°F | Resp 16 | Ht 71.0 in | Wt 188.0 lb

## 2022-04-21 DIAGNOSIS — K219 Gastro-esophageal reflux disease without esophagitis: Secondary | ICD-10-CM | POA: Diagnosis not present

## 2022-04-21 DIAGNOSIS — J3089 Other allergic rhinitis: Secondary | ICD-10-CM

## 2022-04-21 NOTE — Patient Instructions (Addendum)
  1.  Allergen avoidance measures  2.  Treat and prevent inflammation:   A. Ryaltris - 2 sprays each nostril 2 times per day (SP)  B. Montelukast 10 mg - 1 tablet 1 time per day  3. Treat and prevent reflux / LPR:   A. Consolidate caffeine, chocolate, alcohol consumption  B. Nexium 40 mg - 1 tablet in AM  C. Famotidine 40 mg - 1 tablet in PM  4. If needed:   A. OTC antihistamine  B. Nasal saline  5. Return to clinic in 4 weeks or earlier if problem  6. Obtain fall flu vaccine and RSV vaccine

## 2022-04-21 NOTE — Progress Notes (Unsigned)
[facility] [place] [facility] [place] [facility] [place] [facility] [place]   Dear Joylene Draft,  Thank you for referring Cresencio Reesor to the [facility] on 04/21/2022.   Below is a summation of this patient's evaluation and recommendations.  Thank you for your referral. I will keep you informed about this patient's response to treatment.   If you have any questions please do not hesitate to contact me.   Sincerely,  Jiles Prows, MD Allergy / Immunology [place]   ______________________________________________________________________    NEW PATIENT NOTE  Referring Provider: Crist Infante, MD Primary Provider: Crist Infante, MD Date of office visit: 04/21/2022    Subjective:   Chief Complaint:  John Bruce (DOB: 11-18-1950) is a 71 y.o. male who presents to the clinic on 04/21/2022 with a chief complaint of Allergy Testing (Environmental: ALL/Food: No Hx) and Procedure .     HPI: Jeric presents to this clinic in evaluation of persistent respiratory tract symptoms of a progressive nature.  He states that he has "seasonal allergies" that are medicine assisted for the most part his nasal congestion.  This appears to be a perennial problem but certainly is much worse during the fall and to some degree during the spring and there may be exposure to mold while walking outdoors as a precipitant.  This still appears to be a problem even though he consistently uses an antihistamine and montelukast and Flonase.  He does not have any associated anosmia and he does not have a history of recurrent infections requiring antibiotics.  He also has cough.  His cough and postnasal drip and tickle in his throat and glob in his throat and throat clearing.  He does not have shortness of breath or chest tightness.  He does have reflux and has been treated with Nexium.  He will develop problems with heartburn if he misses his Nexium.   He drinks soda about twice a week and has chocolate twice a week and drinks wine about twice a week.  Past Medical History:  Diagnosis Date   Allergy    GERD (gastroesophageal reflux disease)    Hyperlipidemia     Past Surgical History:  Procedure Laterality Date   UPPER GASTROINTESTINAL ENDOSCOPY  2016    Allergies as of 04/21/2022       Reactions   Codeine Nausea And Vomiting        Medication List    aspirin EC 81 MG tablet Take 81 mg by mouth daily.   cetirizine 10 MG tablet Commonly known as: ZYRTEC Take 10 mg by mouth daily.   cholecalciferol 1000 units tablet Commonly known as: VITAMIN D Take 1,000 Units by mouth daily.   esomeprazole 20 MG capsule Commonly known as: NEXIUM Take 20 mg by mouth daily at 12 noon.   ezetimibe 10 MG tablet Commonly known as: ZETIA Take 10 mg by mouth daily.   loratadine 10 MG tablet Commonly known as: CLARITIN Take 10 mg by mouth daily.   meloxicam 15 MG tablet Commonly known as: MOBIC Take 15 mg by mouth daily.   methocarbamol 500 MG tablet Commonly known as: ROBAXIN Take 500 mg by mouth 4 (four) times daily.   montelukast 10 MG tablet Commonly known as: SINGULAIR Take 10 mg by mouth daily.   simvastatin 40 MG tablet Commonly known as: ZOCOR Take 40 mg by mouth daily.    Review of systems negative except as noted in  HPI / PMHx or noted below:  Review of Systems  Constitutional: Negative.   HENT: Negative.    Eyes: Negative.   Respiratory: Negative.    Cardiovascular: Negative.   Gastrointestinal: Negative.   Genitourinary: Negative.   Musculoskeletal: Negative.   Skin: Negative.   Neurological: Negative.   Endo/Heme/Allergies: Negative.   Psychiatric/Behavioral: Negative.      Family History  Problem Relation Age of Onset   Colon cancer Neg Hx    Esophageal cancer Neg Hx    Rectal cancer Neg Hx    Stomach cancer Neg Hx     Social History   Socioeconomic History   Marital status: Married     Spouse name: Not on file   Number of children: Not on file   Years of education: Not on file   Highest education level: Not on file  Occupational History   Not on file  Tobacco Use   Smoking status: Never   Smokeless tobacco: Never  Substance and Sexual Activity   Alcohol use: Yes    Alcohol/week: 3.0 standard drinks of alcohol    Types: 3 Glasses of wine per week   Drug use: No   Sexual activity: Not on file  Other Topics Concern   Not on file  Social History Narrative   Not on file   Environmental and Social history  Lives in a house with a dry environment, no animals located inside the household, carpet in the bedroom, no plastic on the bed, no plastic on the pillow, no smoking ongoing with inside the household.  He is a retired Optometrist.  Objective:   Vitals:   04/21/22 0935  BP: 130/80  Pulse: 78  Resp: 16  Temp: (!) 97.5 F (36.4 C)  SpO2: 96%   Height: '5\' 11"'$  (180.3 cm) Weight: 188 lb (85.3 kg)  Physical Exam Constitutional:      Appearance: He is not diaphoretic.  HENT:     Head: Normocephalic.     Right Ear: Tympanic membrane, ear canal and external ear normal.     Left Ear: Tympanic membrane, ear canal and external ear normal.     Nose: Mucosal edema present. No rhinorrhea.     Mouth/Throat:     Pharynx: Uvula midline. No oropharyngeal exudate.  Eyes:     Conjunctiva/sclera: Conjunctivae normal.  Neck:     Thyroid: No thyromegaly.     Trachea: Trachea normal. No tracheal tenderness or tracheal deviation.  Cardiovascular:     Rate and Rhythm: Normal rate and regular rhythm.     Heart sounds: Normal heart sounds, S1 normal and S2 normal. No murmur heard. Pulmonary:     Effort: No respiratory distress.     Breath sounds: Normal breath sounds. No stridor. No wheezing or rales.  Lymphadenopathy:     Head:     Right side of head: No tonsillar adenopathy.     Left side of head: No tonsillar adenopathy.     Cervical: No cervical adenopathy.   Skin:    Findings: No erythema or rash.     Nails: There is no clubbing.  Neurological:     Mental Status: He is alert.     Diagnostics: Allergy skin tests were performed.   Spirometry was performed and demonstrated an FEV1 of *** @ *** % of predicted. FEV1/FVC = ***  The patient had an Asthma Control Test with the following results:  .    Results of blood tests obtained 25 January 2021 identifies WBC  7.3, absolute eosinophil 200, absolute lymphocyte 1900, hemoglobin 13.6, platelet 294.   Assessment and Plan:    No diagnosis found.  Patient Instructions   1.  Allergen avoidance measures  2.  Treat and prevent inflammation:   A. Ryaltris - 2 sprays each nostril 2 times per day (SP)  B. Montelukast 10 mg - 1 tablet 1 time per day  3. Treat and prevent reflux / LPR:   A. Consolidate caffeine, chocolate, alcohol consumption  B. Nexium 40 mg - 1 tablet in AM  C. Famotidine 40 mg - 1 tablet in PM  4. If needed:   A. OTC antihistamine  B. Nasal saline  5. Return to clinic in 4 weeks or earlier if problem  6. Obtain fall flu vaccine and RSV vaccine   Jiles Prows, MD Allergy / Immunology [facility]

## 2022-04-22 MED ORDER — ESOMEPRAZOLE MAGNESIUM 40 MG PO CPDR: DELAYED_RELEASE_CAPSULE | ORAL | 1 refills | Status: DC

## 2022-04-22 MED ORDER — FAMOTIDINE 40 MG PO TABS: ORAL_TABLET | ORAL | 1 refills | Status: DC

## 2022-04-22 MED ORDER — MONTELUKAST SODIUM 10 MG PO TABS: 10.0000 mg | ORAL_TABLET | Freq: Every day | ORAL | 1 refills | Status: DC

## 2022-04-22 MED ORDER — RYALTRIS 665-25 MCG/ACT NA SUSP: NASAL | 1 refills | Status: DC

## 2022-05-25 ENCOUNTER — Encounter: Payer: Self-pay | Admitting: Allergy and Immunology

## 2022-05-25 ENCOUNTER — Ambulatory Visit: Payer: Medicare HMO | Admitting: Allergy and Immunology

## 2022-05-25 VITALS — BP 128/80 | HR 95 | Resp 16

## 2022-05-25 DIAGNOSIS — K219 Gastro-esophageal reflux disease without esophagitis: Secondary | ICD-10-CM | POA: Diagnosis not present

## 2022-05-25 DIAGNOSIS — J3089 Other allergic rhinitis: Secondary | ICD-10-CM

## 2022-05-25 DIAGNOSIS — J302 Other seasonal allergic rhinitis: Secondary | ICD-10-CM

## 2022-05-25 MED ORDER — ESOMEPRAZOLE MAGNESIUM 40 MG PO CPDR: DELAYED_RELEASE_CAPSULE | ORAL | 5 refills | Status: AC

## 2022-05-25 MED ORDER — FAMOTIDINE 40 MG PO TABS: ORAL_TABLET | ORAL | 5 refills | Status: DC

## 2022-05-25 NOTE — Patient Instructions (Addendum)
  1.  Allergen avoidance measures -dust mite, cockroach, mold  2.  Treat and prevent inflammation:   A. Ryaltris - 2 sprays each nostril 1-2 times per day  B. Montelukast 10 mg - 1 tablet 1 time per day  3. Treat and prevent reflux / LPR:   A. Consolidate caffeine, chocolate, alcohol consumption  B. Nexium 40 mg - 1 tablet in AM  C. Famotidine 40 mg - 1 tablet in PM  4. If needed:   A. OTC antihistamine  B. Nasal saline  5. Return to clinic in December 2023 or earlier if problem.  Taper medications???  6. Obtain RSV vaccine

## 2022-05-25 NOTE — Progress Notes (Signed)
[name]   Follow-up Note  Referring Provider: Crist Infante, MD Primary Provider: Crist Infante, MD Date of Office Visit: 05/25/2022  Subjective:   John Bruce (DOB: 03-Mar-1951) is a 71 y.o. male who returns to the [facility] on 05/25/2022 in re-evaluation of the following:  HPI: John Bruce returns to this clinic in evaluation of allergic rhinitis and LPR.  His last visit to this clinic was his initial evaluation of 21 April 2022.  His nasal issue has completely cleared up while using a nasal steroid and montelukast.  He has performed dust mite avoidance measures with inside the household.  He might have noticed a little bit of sedation while using his plan.  His cough has resolved as has most of his throat issue.  He has been very careful about consuming caffeine and chocolate and he continues on Nexium and famotidine.  He has obtained this years flu vaccine.  Allergies as of 05/25/2022       Reactions   Codeine Nausea And Vomiting        Medication List    cholecalciferol 1000 units tablet Commonly known as: VITAMIN D Take 1,000 Units by mouth daily.   esomeprazole 20 MG capsule Commonly known as: NEXIUM Take 20 mg by mouth daily at 12 noon.   esomeprazole 40 MG capsule Commonly known as: NexIUM Take 1 (ONE) tablet by mouth in the morning.   ezetimibe 10 MG tablet Commonly known as: ZETIA Take 10 mg by mouth daily.   famotidine 40 MG tablet Commonly known as: PEPCID Take 1 (ONE) tablet by mouth in the evening.   loratadine 10 MG tablet Commonly known as: CLARITIN Take 10 mg by mouth daily.   meloxicam 15 MG tablet Commonly known as: MOBIC Take 15 mg by mouth daily.   methocarbamol 500 MG tablet Commonly known as: ROBAXIN Take 500 mg by mouth 4 (four) times daily.   montelukast 10 MG tablet Commonly known as: SINGULAIR Take 1 tablet (10 mg total) by mouth daily.   Ryaltris 761-95  MCG/ACT Susp Generic drug: Olopatadine-Mometasone 2 sprays each nostril 2 (TWO) times per day.   simvastatin 40 MG tablet Commonly known as: ZOCOR Take 40 mg by mouth daily.    Past Medical History:  Diagnosis Date   Allergy    GERD (gastroesophageal reflux disease)    Hyperlipidemia     Past Surgical History:  Procedure Laterality Date   UPPER GASTROINTESTINAL ENDOSCOPY  2016    Review of systems negative except as noted in HPI / PMHx or noted below:  Review of Systems  Constitutional: Negative.   HENT: Negative.    Eyes: Negative.   Respiratory: Negative.    Cardiovascular: Negative.   Gastrointestinal: Negative.   Genitourinary: Negative.   Musculoskeletal: Negative.   Skin: Negative.   Neurological: Negative.   Endo/Heme/Allergies: Negative.   Psychiatric/Behavioral: Negative.       Objective:   Vitals:   05/25/22 1012  BP: 128/80  Pulse: 95  Resp: 16  SpO2: 98%          Physical Exam Constitutional:      Appearance: He is not diaphoretic.  HENT:     Head: Normocephalic.     Right Ear: Tympanic membrane, ear canal and external ear normal.     Left Ear: Tympanic membrane, ear canal and external ear normal.     Nose: Nose normal. No mucosal edema or rhinorrhea.  Mouth/Throat:     Pharynx: Uvula midline. No oropharyngeal exudate.  Eyes:     Conjunctiva/sclera: Conjunctivae normal.  Neck:     Thyroid: No thyromegaly.     Trachea: Trachea normal. No tracheal tenderness or tracheal deviation.  Lymphadenopathy:     Head:     Right side of head: No tonsillar adenopathy.     Left side of head: No tonsillar adenopathy.     Cervical: No cervical adenopathy.  Skin:    Findings: No erythema or rash.     Nails: There is no clubbing.  Neurological:     Mental Status: He is alert.     Diagnostics: none  Assessment and Plan:   1. Perennial allergic rhinitis   2. Seasonal allergic rhinitis due to fungal spores   3. LPRD (laryngopharyngeal  reflux disease)    1.  Allergen avoidance measures -dust mite, cockroach, mold  2.  Treat and prevent inflammation:   A. Ryaltris - 2 sprays each nostril 1-2 times per day  B. Montelukast 10 mg - 1 tablet 1 time per day  3. Treat and prevent reflux / LPR:   A. Consolidate caffeine, chocolate, alcohol consumption  B. Nexium 40 mg - 1 tablet in AM  C. Famotidine 40 mg - 1 tablet in PM  4. If needed:   A. OTC antihistamine  B. Nasal saline  5. Return to clinic in December 2023 or earlier if problem.  Taper medications???  6. Obtain RSV vaccine  John Bruce appears to be doing much better on his current plan of anti-inflammatory agents for his airway, allergen avoidance measures directed against specific aeroallergens, and therapy directed against LPR.  I would like to keep him on this plan until December 2023 at which point in time they will probably be an opportunity to consolidate his treatment.  We will have him decrease his nasal steroid/antihistamine to just 1 time per day during today's visit to see if this addresses his possible medication induced sedation.Allena Katz, MD Allergy / Immunology [facility]

## 2022-05-26 ENCOUNTER — Encounter: Payer: Self-pay | Admitting: Allergy and Immunology

## 2022-06-17 DIAGNOSIS — H5203 Hypermetropia, bilateral: Secondary | ICD-10-CM | POA: Diagnosis not present

## 2022-06-29 ENCOUNTER — Other Ambulatory Visit: Payer: Self-pay | Admitting: *Deleted

## 2022-06-29 MED ORDER — RYALTRIS 665-25 MCG/ACT NA SUSP: NASAL | 1 refills | Status: DC

## 2022-07-02 ENCOUNTER — Other Ambulatory Visit: Payer: Self-pay

## 2022-07-02 MED ORDER — RYALTRIS 665-25 MCG/ACT NA SUSP: NASAL | 1 refills | Status: DC

## 2022-07-06 ENCOUNTER — Ambulatory Visit: Payer: Medicare HMO | Admitting: Allergy and Immunology

## 2022-07-06 ENCOUNTER — Encounter: Payer: Self-pay | Admitting: Allergy and Immunology

## 2022-07-06 VITALS — BP 118/82 | HR 104 | Resp 8

## 2022-07-06 DIAGNOSIS — J3089 Other allergic rhinitis: Secondary | ICD-10-CM

## 2022-07-06 DIAGNOSIS — K219 Gastro-esophageal reflux disease without esophagitis: Secondary | ICD-10-CM | POA: Diagnosis not present

## 2022-07-06 DIAGNOSIS — J302 Other seasonal allergic rhinitis: Secondary | ICD-10-CM

## 2022-07-06 NOTE — Progress Notes (Unsigned)
[place] - [place] - [place]   Follow-up Note  Referring Provider: Crist Infante, MD Primary Provider: Crist Infante, MD Date of Office Visit: 07/06/2022  Subjective:   John Bruce (DOB: March 20, 1951) is a 71 y.o. male who returns to the [facility] on 07/06/2022 in re-evaluation of the following:  HPI: Jamespresents to this clinic in evaluation of allergic rhinitis and LPR.  His last visit to this clinic was 25 May 2022.  He has continued to do very well while utilizing Ryaltris 1 time per day, montelukast 1 time per day, and Nexium plus famotidine.  He has not had any significant upper airway symptoms, throat issues, reflux issues.  He has not required a systemic steroid or an antibiotic since I have last seen him in this clinic.  He has received the flu vaccine and RSV vaccine.  Allergies as of 07/06/2022       Reactions   Codeine Nausea And Vomiting        Medication List    cholecalciferol 1000 units tablet Commonly known as: VITAMIN D Take 1,000 Units by mouth daily.   esomeprazole 40 MG capsule Commonly known as: NexIUM Take 1 (ONE) tablet by mouth in the morning.   ezetimibe 10 MG tablet Commonly known as: ZETIA Take 10 mg by mouth daily.   famotidine 40 MG tablet Commonly known as: PEPCID Take 1 (ONE) tablet by mouth in the evening.   meloxicam 15 MG tablet Commonly known as: MOBIC Take 15 mg by mouth daily.   methocarbamol 500 MG tablet Commonly known as: ROBAXIN Take 500 mg by mouth 4 (four) times daily.   montelukast 10 MG tablet Commonly known as: SINGULAIR Take 1 tablet (10 mg total) by mouth daily.   Ryaltris 884-16 MCG/ACT Susp Generic drug: Olopatadine-Mometasone 2 sprays each nostril 2 (TWO) times per day.   simvastatin 40 MG tablet Commonly known as: ZOCOR Take 40 mg by mouth daily.        Past Medical History:  Diagnosis Date   Allergy    GERD (gastroesophageal reflux  disease)    Hyperlipidemia     Past Surgical History:  Procedure Laterality Date   UPPER GASTROINTESTINAL ENDOSCOPY  2016    Review of systems negative except as noted in HPI / PMHx or noted below:  Review of Systems  Constitutional: Negative.   HENT: Negative.    Eyes: Negative.   Respiratory: Negative.    Cardiovascular: Negative.   Gastrointestinal: Negative.   Genitourinary: Negative.   Musculoskeletal: Negative.   Skin: Negative.   Neurological: Negative.   Endo/Heme/Allergies: Negative.   Psychiatric/Behavioral: Negative.       Objective:   Vitals:   07/06/22 0904  BP: 118/82  Pulse: (!) 104  Resp: (!) 8  SpO2: 95%          Physical Exam Constitutional:      Appearance: He is not diaphoretic.  HENT:     Head: Normocephalic.     Right Ear: Tympanic membrane, ear canal and external ear normal.     Left Ear: Tympanic membrane, ear canal and external ear normal.     Nose: Nose normal. No mucosal edema or rhinorrhea.     Mouth/Throat:     Pharynx: Uvula midline. No oropharyngeal exudate.  Eyes:     Conjunctiva/sclera: Conjunctivae normal.  Neck:     Thyroid: No thyromegaly.     Trachea: Trachea normal. No tracheal tenderness or tracheal deviation.  Cardiovascular:     Rate and Rhythm: Normal rate and regular rhythm.     Heart sounds: Normal heart sounds, S1 normal and S2 normal. No murmur heard. Pulmonary:     Effort: No respiratory distress.     Breath sounds: Normal breath sounds. No stridor. No wheezing or rales.  Lymphadenopathy:     Head:     Right side of head: No tonsillar adenopathy.     Left side of head: No tonsillar adenopathy.     Cervical: No cervical adenopathy.  Skin:    Findings: No erythema or rash.     Nails: There is no clubbing.  Neurological:     Mental Status: He is alert.     Diagnostics: none  Assessment and Plan:   1. Perennial allergic rhinitis   2. Seasonal allergic rhinitis due to fungal spores   3. LPRD  (laryngopharyngeal reflux disease)    1.  Allergen avoidance measures -dust mite, cockroach, mold  2.  Treat and prevent inflammation:   A. Ryaltris - 2 sprays each nostril 1-2 times per day  3. Treat and prevent reflux / LPR:   A. Consolidate caffeine, chocolate, alcohol consumption  B. Nexium 40 mg - 1 tablet in AM  4. If needed:   A. OTC antihistamine  B. Nasal saline  C. Famotidine 40 mg - 1 tablet in PM   D. Montelukast 10 mg - 1 tablet 1 time per day  5.  Return in 1 year or earlier if problem  Ava appears to be doing quite well and I think there is an opportunity to consolidate some of his medical treatment and we will see if he can do as well while he eliminates his famotidine and montelukast.  Assuming he does well with the plan noted above I will see him back in this clinic in 1 year or earlier if there is a problem.  Allena Katz, MD Allergy / Immunology [facility]

## 2022-07-06 NOTE — Patient Instructions (Addendum)
  1.  Allergen avoidance measures -dust mite, cockroach, mold  2.  Treat and prevent inflammation:   A. Ryaltris - 2 sprays each nostril 1-2 times per day  3. Treat and prevent reflux / LPR:   A. Consolidate caffeine, chocolate, alcohol consumption  B. Nexium 40 mg - 1 tablet in AM  4. If needed:   A. OTC antihistamine  B. Nasal saline  C. Famotidine 40 mg - 1 tablet in PM   D. Montelukast 10 mg - 1 tablet 1 time per day  5.  Return in 1 year or earlier if problem

## 2022-07-07 ENCOUNTER — Encounter: Payer: Self-pay | Admitting: Allergy and Immunology

## 2022-08-27 DIAGNOSIS — R946 Abnormal results of thyroid function studies: Secondary | ICD-10-CM | POA: Diagnosis not present

## 2022-08-27 DIAGNOSIS — K219 Gastro-esophageal reflux disease without esophagitis: Secondary | ICD-10-CM | POA: Diagnosis not present

## 2022-08-27 DIAGNOSIS — E785 Hyperlipidemia, unspecified: Secondary | ICD-10-CM | POA: Diagnosis not present

## 2022-08-27 DIAGNOSIS — Z125 Encounter for screening for malignant neoplasm of prostate: Secondary | ICD-10-CM | POA: Diagnosis not present

## 2022-09-02 DIAGNOSIS — Z1212 Encounter for screening for malignant neoplasm of rectum: Secondary | ICD-10-CM | POA: Diagnosis not present

## 2022-09-03 DIAGNOSIS — R82998 Other abnormal findings in urine: Secondary | ICD-10-CM | POA: Diagnosis not present

## 2022-09-03 DIAGNOSIS — R946 Abnormal results of thyroid function studies: Secondary | ICD-10-CM | POA: Diagnosis not present

## 2022-09-03 DIAGNOSIS — Z1389 Encounter for screening for other disorder: Secondary | ICD-10-CM | POA: Diagnosis not present

## 2022-09-03 DIAGNOSIS — R1310 Dysphagia, unspecified: Secondary | ICD-10-CM | POA: Diagnosis not present

## 2022-09-03 DIAGNOSIS — E785 Hyperlipidemia, unspecified: Secondary | ICD-10-CM | POA: Diagnosis not present

## 2022-09-03 DIAGNOSIS — Z0000 Encounter for general adult medical examination without abnormal findings: Secondary | ICD-10-CM | POA: Diagnosis not present

## 2022-09-03 DIAGNOSIS — K219 Gastro-esophageal reflux disease without esophagitis: Secondary | ICD-10-CM | POA: Diagnosis not present

## 2022-09-03 DIAGNOSIS — J309 Allergic rhinitis, unspecified: Secondary | ICD-10-CM | POA: Diagnosis not present

## 2022-09-03 DIAGNOSIS — H9319 Tinnitus, unspecified ear: Secondary | ICD-10-CM | POA: Diagnosis not present

## 2022-09-03 DIAGNOSIS — I700 Atherosclerosis of aorta: Secondary | ICD-10-CM | POA: Diagnosis not present

## 2022-09-03 DIAGNOSIS — I499 Cardiac arrhythmia, unspecified: Secondary | ICD-10-CM | POA: Diagnosis not present

## 2022-09-25 DIAGNOSIS — R69 Illness, unspecified: Secondary | ICD-10-CM | POA: Diagnosis not present

## 2022-11-16 DIAGNOSIS — E785 Hyperlipidemia, unspecified: Secondary | ICD-10-CM | POA: Diagnosis not present

## 2022-11-16 DIAGNOSIS — Z7689 Persons encountering health services in other specified circumstances: Secondary | ICD-10-CM | POA: Diagnosis not present

## 2022-11-19 DIAGNOSIS — D692 Other nonthrombocytopenic purpura: Secondary | ICD-10-CM | POA: Diagnosis not present

## 2022-11-19 DIAGNOSIS — L821 Other seborrheic keratosis: Secondary | ICD-10-CM | POA: Diagnosis not present

## 2022-11-19 DIAGNOSIS — L80 Vitiligo: Secondary | ICD-10-CM | POA: Diagnosis not present

## 2022-11-19 DIAGNOSIS — D1801 Hemangioma of skin and subcutaneous tissue: Secondary | ICD-10-CM | POA: Diagnosis not present

## 2022-12-03 ENCOUNTER — Telehealth: Payer: Self-pay | Admitting: Allergy and Immunology

## 2022-12-03 ENCOUNTER — Other Ambulatory Visit: Payer: Self-pay | Admitting: *Deleted

## 2022-12-03 MED ORDER — RYALTRIS 665-25 MCG/ACT NA SUSP: NASAL | 5 refills | Status: DC

## 2022-12-03 NOTE — Telephone Encounter (Signed)
Patient is requesting a refill on Ryaltris.

## 2022-12-03 NOTE — Telephone Encounter (Signed)
RX sent to [facility].

## 2023-06-03 ENCOUNTER — Other Ambulatory Visit: Payer: Self-pay

## 2023-06-03 MED ORDER — RYALTRIS 665-25 MCG/ACT NA SUSP: NASAL | 5 refills | Status: DC

## 2023-06-07 ENCOUNTER — Other Ambulatory Visit: Payer: Self-pay | Admitting: *Deleted

## 2023-06-07 DIAGNOSIS — L03011 Cellulitis of right finger: Secondary | ICD-10-CM | POA: Diagnosis not present

## 2023-06-07 MED ORDER — RYALTRIS 665-25 MCG/ACT NA SUSP: NASAL | 5 refills | Status: AC

## 2023-07-12 ENCOUNTER — Other Ambulatory Visit: Payer: Self-pay | Admitting: Allergy and Immunology

## 2023-08-11 ENCOUNTER — Ambulatory Visit: Payer: PPO | Admitting: Allergy and Immunology

## 2023-08-11 ENCOUNTER — Encounter: Payer: Self-pay | Admitting: Allergy and Immunology

## 2023-08-11 VITALS — BP 126/72 | HR 96 | Resp 16 | Ht 71.0 in | Wt 195.0 lb

## 2023-08-11 DIAGNOSIS — J302 Other seasonal allergic rhinitis: Secondary | ICD-10-CM

## 2023-08-11 DIAGNOSIS — J3089 Other allergic rhinitis: Secondary | ICD-10-CM

## 2023-08-11 DIAGNOSIS — K219 Gastro-esophageal reflux disease without esophagitis: Secondary | ICD-10-CM | POA: Diagnosis not present

## 2023-08-11 MED ORDER — MONTELUKAST SODIUM 10 MG PO TABS: 10.0000 mg | ORAL_TABLET | Freq: Every day | ORAL | 3 refills | Status: AC

## 2023-08-11 NOTE — Progress Notes (Signed)
 [place] - [place] [facility] [place] - [place]   Follow-up Note  Referring Provider: Shayne Anes, MD Primary Provider: Shayne Anes, MD Date of Office Visit: 08/11/2023  Subjective:   John Bruce (DOB: 14-Mar-1951) is a 73 y.o. male [facility] in re-evaluation of the following:  HPI: John Bruce returns to this clinic in evaluation of allergic rhinitis and LPR.  I last saw him in this clinic 06 July 2022.  He has really done very well since his last visit and has not required a systemic steroid or an antibiotic for any type of airway issue and rarely has any upper airway symptoms while he continues to use his Ryaltris  1 time per day and on most days montelukast ..  He has been able to go through each season of the year with no problem.  His reflux is also under excellent control while using Nexium  and he no longer uses any famotidine .  He has received this years flu vaccine and COVID-vaccine.  Allergies as of 08/11/2023       Reactions   Codeine Nausea And Vomiting        Medication List    cholecalciferol 1000 units tablet Commonly known as: VITAMIN D Take 1,000 Units by mouth daily.   esomeprazole  40 MG capsule Commonly known as: NexIUM  Take 1 (ONE) tablet by mouth in the morning.   ezetimibe 10 MG tablet Commonly known as: ZETIA Take 10 mg by mouth daily.   meloxicam 15 MG tablet Commonly known as: MOBIC Take 15 mg by mouth daily.   methocarbamol 500 MG tablet Commonly known as: ROBAXIN Take 500 mg by mouth 4 (four) times daily.   montelukast  10 MG tablet Commonly known as: SINGULAIR  TAKE 1 TABLET (10 MG TOTAL) BY MOUTH DAILY.   Ryaltris  665-25 MCG/ACT Susp Generic drug: Olopatadine-Mometasone 2 sprays each nostril 2 times per day.   simvastatin 40 MG tablet Commonly known as: ZOCOR Take 40 mg by mouth daily.    Past Medical History:  Diagnosis Date   Allergy     GERD (gastroesophageal  reflux disease)    Hyperlipidemia     Past Surgical History:  Procedure Laterality Date   UPPER GASTROINTESTINAL ENDOSCOPY  2016    Review of systems negative except as noted in HPI / PMHx or noted below:  Review of Systems  Constitutional: Negative.   HENT: Negative.    Eyes: Negative.   Respiratory: Negative.    Cardiovascular: Negative.   Gastrointestinal: Negative.   Genitourinary: Negative.   Musculoskeletal: Negative.   Skin: Negative.   Neurological: Negative.   Endo/Heme/Allergies: Negative.   Psychiatric/Behavioral: Negative.       Objective:   Vitals:   08/11/23 0802  BP: 126/72  Pulse: 96  Resp: 16  SpO2: 97%   Height: 5' 11 (180.3 cm)  Weight: 195 lb (88.5 kg)   Physical Exam Constitutional:      Appearance: He is not diaphoretic.  HENT:     Head: Normocephalic.     Right Ear: Tympanic membrane, ear canal and external ear normal.     Left Ear: Tympanic membrane, ear canal and external ear normal.     Nose: Nose normal. No mucosal edema or rhinorrhea.     Mouth/Throat:     Pharynx: Uvula midline. No oropharyngeal exudate.  Eyes:     Conjunctiva/sclera: Conjunctivae normal.  Neck:     Thyroid : No thyromegaly.     Trachea: Trachea normal.  No tracheal tenderness or tracheal deviation.  Cardiovascular:     Rate and Rhythm: Normal rate and regular rhythm.     Heart sounds: Normal heart sounds, S1 normal and S2 normal. No murmur heard. Pulmonary:     Effort: No respiratory distress.     Breath sounds: Normal breath sounds. No stridor. No wheezing or rales.  Lymphadenopathy:     Head:     Right side of head: No tonsillar adenopathy.     Left side of head: No tonsillar adenopathy.     Cervical: No cervical adenopathy.  Skin:    Findings: No erythema or rash.     Nails: There is no clubbing.  Neurological:     Mental Status: He is alert.     Diagnostics: none  Assessment and Plan:   1. Perennial allergic rhinitis   2. Seasonal allergic  rhinitis due to fungal spores   3. LPRD (laryngopharyngeal reflux disease)    1.  Allergen avoidance measures -dust mite, cockroach, mold  2.  Treat and prevent inflammation:   A. Ryaltris  - 2 sprays each nostril 1-2 times per day  3. Treat and prevent reflux / LPR:   A. Consolidate caffeine, chocolate, alcohol  consumption  B. Nexium  40 mg - 1 tablet in AM  4. If needed:   A. OTC antihistamine  B. Nasal saline  C. Montelukast  10 mg - 1 tablet 1 time per day  5.  Return in 1 year or earlier if problem  [name] is doing wonderful on his current plan.  He has a good understanding of his disease state and how to use his medications with appropriate dosing and we will see him back in this clinic in 1 year or earlier if there is a problem.  Camellia Denis, MD Allergy  / Immunology [facility]

## 2023-08-11 NOTE — Patient Instructions (Addendum)
  1.  Allergen avoidance measures -dust mite, cockroach, mold  2.  Treat and prevent inflammation:   A. Ryaltris  - 2 sprays each nostril 1-2 times per day  3. Treat and prevent reflux / LPR:   A. Consolidate caffeine, chocolate, alcohol  consumption  B. Nexium  40 mg - 1 tablet in AM  4. If needed:   A. OTC antihistamine  B. Nasal saline  C. Montelukast  10 mg - 1 tablet 1 time per day  5.  Return in 1 year or earlier if problem

## 2023-08-12 ENCOUNTER — Encounter: Payer: Self-pay | Admitting: Allergy and Immunology

## 2023-11-01 DIAGNOSIS — H5203 Hypermetropia, bilateral: Secondary | ICD-10-CM | POA: Diagnosis not present

## 2023-11-01 DIAGNOSIS — H52223 Regular astigmatism, bilateral: Secondary | ICD-10-CM | POA: Diagnosis not present

## 2023-11-01 DIAGNOSIS — H53143 Visual discomfort, bilateral: Secondary | ICD-10-CM | POA: Diagnosis not present

## 2023-11-01 DIAGNOSIS — H2513 Age-related nuclear cataract, bilateral: Secondary | ICD-10-CM | POA: Diagnosis not present

## 2023-11-01 DIAGNOSIS — H524 Presbyopia: Secondary | ICD-10-CM | POA: Diagnosis not present

## 2023-11-08 DIAGNOSIS — Z1212 Encounter for screening for malignant neoplasm of rectum: Secondary | ICD-10-CM | POA: Diagnosis not present

## 2023-11-08 DIAGNOSIS — E785 Hyperlipidemia, unspecified: Secondary | ICD-10-CM | POA: Diagnosis not present

## 2023-11-08 DIAGNOSIS — R946 Abnormal results of thyroid function studies: Secondary | ICD-10-CM | POA: Diagnosis not present

## 2023-11-08 DIAGNOSIS — R846 Abnormal cytological findings in specimens from respiratory organs and thorax: Secondary | ICD-10-CM | POA: Diagnosis not present

## 2023-11-08 DIAGNOSIS — Z125 Encounter for screening for malignant neoplasm of prostate: Secondary | ICD-10-CM | POA: Diagnosis not present

## 2023-11-15 DIAGNOSIS — Z0000 Encounter for general adult medical examination without abnormal findings: Secondary | ICD-10-CM | POA: Diagnosis not present

## 2023-11-15 DIAGNOSIS — E785 Hyperlipidemia, unspecified: Secondary | ICD-10-CM | POA: Diagnosis not present

## 2023-11-15 DIAGNOSIS — I700 Atherosclerosis of aorta: Secondary | ICD-10-CM | POA: Diagnosis not present

## 2023-11-15 DIAGNOSIS — R82998 Other abnormal findings in urine: Secondary | ICD-10-CM | POA: Diagnosis not present

## 2023-11-15 DIAGNOSIS — H9319 Tinnitus, unspecified ear: Secondary | ICD-10-CM | POA: Diagnosis not present

## 2023-11-15 DIAGNOSIS — Z5181 Encounter for therapeutic drug level monitoring: Secondary | ICD-10-CM | POA: Diagnosis not present

## 2023-11-15 DIAGNOSIS — K219 Gastro-esophageal reflux disease without esophagitis: Secondary | ICD-10-CM | POA: Diagnosis not present

## 2023-11-15 DIAGNOSIS — R946 Abnormal results of thyroid function studies: Secondary | ICD-10-CM | POA: Diagnosis not present

## 2023-11-15 DIAGNOSIS — Z79899 Other long term (current) drug therapy: Secondary | ICD-10-CM | POA: Diagnosis not present

## 2023-11-15 DIAGNOSIS — U071 COVID-19: Secondary | ICD-10-CM | POA: Diagnosis not present

## 2023-11-15 DIAGNOSIS — J309 Allergic rhinitis, unspecified: Secondary | ICD-10-CM | POA: Diagnosis not present

## 2023-11-23 DIAGNOSIS — L821 Other seborrheic keratosis: Secondary | ICD-10-CM | POA: Diagnosis not present

## 2023-11-23 DIAGNOSIS — L80 Vitiligo: Secondary | ICD-10-CM | POA: Diagnosis not present

## 2023-11-23 DIAGNOSIS — D2372 Other benign neoplasm of skin of left lower limb, including hip: Secondary | ICD-10-CM | POA: Diagnosis not present

## 2023-11-23 DIAGNOSIS — L570 Actinic keratosis: Secondary | ICD-10-CM | POA: Diagnosis not present

## 2023-11-23 DIAGNOSIS — L738 Other specified follicular disorders: Secondary | ICD-10-CM | POA: Diagnosis not present

## 2023-11-23 DIAGNOSIS — L814 Other melanin hyperpigmentation: Secondary | ICD-10-CM | POA: Diagnosis not present

## 2024-01-10 DIAGNOSIS — R946 Abnormal results of thyroid function studies: Secondary | ICD-10-CM | POA: Diagnosis not present

## 2024-06-07 DIAGNOSIS — E039 Hypothyroidism, unspecified: Secondary | ICD-10-CM | POA: Diagnosis not present
